# Patient Record
Sex: Female | Born: 1960 | Race: White | Hispanic: No | Marital: Married | Smoking: Never smoker
Health system: Southern US, Community
[De-identification: ages and names within clinical notes are randomized; demographics above are authoritative.]

## PROBLEM LIST (undated history)

## (undated) DIAGNOSIS — I10 Essential (primary) hypertension: Secondary | ICD-10-CM

## (undated) HISTORY — DX: Essential (primary) hypertension: I10

---

## 1997-10-09 ENCOUNTER — Other Ambulatory Visit: Admission: RE | Admit: 1997-10-09 | Discharge: 1997-10-09 | Payer: Self-pay | Admitting: Obstetrics and Gynecology

## 1998-05-30 ENCOUNTER — Inpatient Hospital Stay (HOSPITAL_COMMUNITY): Admission: AD | Admit: 1998-05-30 | Discharge: 1998-06-01 | Payer: Self-pay | Admitting: Unknown Physician Specialty

## 1998-07-04 ENCOUNTER — Encounter: Admission: RE | Admit: 1998-07-04 | Discharge: 1998-10-02 | Payer: Self-pay | Admitting: Obstetrics and Gynecology

## 1998-10-08 ENCOUNTER — Encounter (HOSPITAL_COMMUNITY): Admission: RE | Admit: 1998-10-08 | Discharge: 1999-01-06 | Payer: Self-pay | Admitting: Obstetrics and Gynecology

## 1998-10-28 ENCOUNTER — Other Ambulatory Visit: Admission: RE | Admit: 1998-10-28 | Discharge: 1998-10-28 | Payer: Self-pay | Admitting: Obstetrics and Gynecology

## 2001-02-17 ENCOUNTER — Other Ambulatory Visit: Admission: RE | Admit: 2001-02-17 | Discharge: 2001-02-17 | Payer: Self-pay | Admitting: Obstetrics and Gynecology

## 2002-02-20 ENCOUNTER — Other Ambulatory Visit: Admission: RE | Admit: 2002-02-20 | Discharge: 2002-02-20 | Payer: Self-pay | Admitting: Obstetrics and Gynecology

## 2003-05-14 ENCOUNTER — Other Ambulatory Visit: Admission: RE | Admit: 2003-05-14 | Discharge: 2003-05-14 | Payer: Self-pay | Admitting: Obstetrics and Gynecology

## 2004-04-02 ENCOUNTER — Encounter: Admission: RE | Admit: 2004-04-02 | Discharge: 2004-04-02 | Payer: Self-pay | Admitting: Obstetrics and Gynecology

## 2004-11-04 ENCOUNTER — Other Ambulatory Visit: Admission: RE | Admit: 2004-11-04 | Discharge: 2004-11-04 | Payer: Self-pay | Admitting: Obstetrics and Gynecology

## 2005-01-29 ENCOUNTER — Ambulatory Visit (HOSPITAL_COMMUNITY): Admission: RE | Admit: 2005-01-29 | Discharge: 2005-01-29 | Payer: Self-pay | Admitting: Obstetrics and Gynecology

## 2005-01-29 ENCOUNTER — Encounter (INDEPENDENT_AMBULATORY_CARE_PROVIDER_SITE_OTHER): Payer: Self-pay | Admitting: Specialist

## 2005-12-09 ENCOUNTER — Encounter: Admission: RE | Admit: 2005-12-09 | Discharge: 2005-12-09 | Payer: Self-pay | Admitting: Obstetrics and Gynecology

## 2008-10-08 ENCOUNTER — Encounter: Admission: RE | Admit: 2008-10-08 | Discharge: 2008-10-08 | Payer: Self-pay | Admitting: Obstetrics and Gynecology

## 2010-05-03 ENCOUNTER — Encounter: Payer: Self-pay | Admitting: Obstetrics and Gynecology

## 2010-05-04 ENCOUNTER — Encounter: Payer: Self-pay | Admitting: Obstetrics and Gynecology

## 2010-08-28 NOTE — Op Note (Signed)
Holly Choi, Holly Choi            ACCOUNT NO.:  [id number]   MEDICAL RECORD NO.:  [id number]          PATIENT TYPE:  AMB   LOCATION:  [facility]                           FACILITY:  [facility]   PHYSICIAN:  Rio L. Grewal, M.D.DATE OF BIRTH:  16-Dec-1960   DATE OF PROCEDURE:  01/29/2005  DATE OF DISCHARGE:                                 OPERATIVE REPORT   PREOPERATIVE DIAGNOSES:  1.  Menorrhagia.  2.  Pelvic relaxation.   POSTOPERATIVE DIAGNOSES:  1.  Menorrhagia.  2.  Pelvic relaxation.   PROCEDURES:  1.  Dilatation and curettage.  2.  Diagnostic hysteroscopy.  3.  ThermaChoice endometrial ablation.  4.  Perineoplasty.   SURGEON:  Delorise L. Vincente Poli, M.D.   ANESTHESIA:  General.   SPECIMENS:  Uterine curettings.   ESTIMATED BLOOD LOSS:  Minimal.   COMPLICATIONS:  None.   PROCEDURE:  Patient taken to the operating room.  She was intubated and  given anesthesia.  She was prepped and draped in the usual sterile fashion.  In-and-out catheter used to empty the bladder.  A speculum was inserted to  the vagina.  The cervix was grasped with a tenaculum and a paracervical  block was performed.  The cervical internal os was gently dilated using  Pratt dilators.  The diagnostic hysteroscope was inserted into the uterine  cavity and with excellent visualization the endometrium appeared normal.  The hysteroscope was removed and a thorough sharp uterine curettage was  performed of all four walls of the uterus and all tissue was sent to  pathology.  The ThermaChoice III was inserted into the uterus and  ThermaChoice III endometrial ablation is performed for eight minutes  according to the manufacturer's specifications.  The intact balloon was  removed.  Minimal drainage was noted.  We then removed the instruments from  the vagina.  At this point we performed a perineoplasty by placing an Allis  clamps 5 to 7 o'clock in the perineum, making a V-shaped incision with a  scalpel and undermining  some of the tissue using Metzenbaum scissors in the  perineum.  We then reinforced this area with interrupteds using 0 Vicryl  suture, reapproximating this and strengthening the perineal body and then  closed this using 2-0 Vicryl suture  continuous running stitch.  After the incision was closed, the patient was  awakened from anesthesia.  She went to the recovery room in stable  condition.  All sponge, lap and instrument counts were correct x2.      Sharine L. Vincente Poli, M.D.  Electronically Signed     MLG/MEDQ  D:  01/29/2005  T:  01/29/2005  Job:  [id number]

## 2012-03-22 ENCOUNTER — Other Ambulatory Visit: Payer: Self-pay | Admitting: Obstetrics and Gynecology

## 2012-03-31 ENCOUNTER — Other Ambulatory Visit: Payer: Self-pay | Admitting: Obstetrics and Gynecology

## 2012-03-31 DIAGNOSIS — R928 Other abnormal and inconclusive findings on diagnostic imaging of breast: Secondary | ICD-10-CM

## 2012-04-10 ENCOUNTER — Ambulatory Visit
Admission: RE | Admit: 2012-04-10 | Discharge: 2012-04-10 | Disposition: A | Discharge status: Home / Self Care | Payer: BC Managed Care – PPO | Source: Ambulatory Visit | Attending: Obstetrics and Gynecology | Admitting: Obstetrics and Gynecology

## 2012-04-10 DIAGNOSIS — R928 Other abnormal and inconclusive findings on diagnostic imaging of breast: Secondary | ICD-10-CM

## 2012-09-25 ENCOUNTER — Other Ambulatory Visit: Payer: Self-pay | Admitting: Obstetrics and Gynecology

## 2012-09-25 DIAGNOSIS — R921 Mammographic calcification found on diagnostic imaging of breast: Secondary | ICD-10-CM

## 2012-10-10 ENCOUNTER — Ambulatory Visit
Admission: RE | Admit: 2012-10-10 | Discharge: 2012-10-10 | Disposition: A | Discharge status: Home / Self Care | Payer: BC Managed Care – PPO | Source: Ambulatory Visit | Attending: Obstetrics and Gynecology | Admitting: Obstetrics and Gynecology

## 2012-10-10 DIAGNOSIS — R921 Mammographic calcification found on diagnostic imaging of breast: Secondary | ICD-10-CM

## 2013-04-19 ENCOUNTER — Other Ambulatory Visit: Payer: Self-pay | Admitting: Obstetrics and Gynecology

## 2013-04-19 DIAGNOSIS — R921 Mammographic calcification found on diagnostic imaging of breast: Secondary | ICD-10-CM

## 2013-04-30 ENCOUNTER — Ambulatory Visit
Admission: RE | Admit: 2013-04-30 | Discharge: 2013-04-30 | Disposition: A | Discharge status: Home / Self Care | Payer: BC Managed Care – PPO | Source: Ambulatory Visit | Attending: Obstetrics and Gynecology | Admitting: Obstetrics and Gynecology

## 2013-04-30 DIAGNOSIS — R921 Mammographic calcification found on diagnostic imaging of breast: Secondary | ICD-10-CM

## 2013-05-30 ENCOUNTER — Other Ambulatory Visit: Payer: Self-pay | Admitting: Obstetrics and Gynecology

## 2014-06-03 ENCOUNTER — Other Ambulatory Visit: Payer: Self-pay | Admitting: Obstetrics and Gynecology

## 2014-06-04 LAB — CYTOLOGY - PAP

## 2017-09-23 ENCOUNTER — Other Ambulatory Visit: Payer: Self-pay | Admitting: Obstetrics and Gynecology

## 2017-09-23 ENCOUNTER — Ambulatory Visit: Payer: BC Managed Care – PPO

## 2017-09-23 DIAGNOSIS — Z1231 Encounter for screening mammogram for malignant neoplasm of breast: Secondary | ICD-10-CM

## 2020-09-05 ENCOUNTER — Other Ambulatory Visit: Payer: Self-pay | Admitting: Pediatrics

## 2020-09-05 ENCOUNTER — Other Ambulatory Visit: Payer: Self-pay | Admitting: Radiology

## 2020-09-05 DIAGNOSIS — N63 Unspecified lump in unspecified breast: Secondary | ICD-10-CM

## 2020-09-12 ENCOUNTER — Ambulatory Visit
Admission: RE | Admit: 2020-09-12 | Discharge: 2020-09-12 | Disposition: A | Discharge status: Home / Self Care | Payer: BC Managed Care – PPO | Source: Ambulatory Visit | Attending: Radiology | Admitting: Radiology

## 2020-09-12 ENCOUNTER — Other Ambulatory Visit: Payer: Self-pay

## 2020-09-12 ENCOUNTER — Other Ambulatory Visit: Payer: Self-pay | Admitting: Radiology

## 2020-09-12 DIAGNOSIS — N63 Unspecified lump in unspecified breast: Secondary | ICD-10-CM

## 2020-09-19 ENCOUNTER — Other Ambulatory Visit: Payer: BC Managed Care – PPO

## 2020-09-22 ENCOUNTER — Other Ambulatory Visit: Payer: Self-pay | Admitting: Radiology

## 2020-09-22 ENCOUNTER — Other Ambulatory Visit: Payer: Self-pay

## 2020-09-22 ENCOUNTER — Ambulatory Visit
Admission: RE | Admit: 2020-09-22 | Discharge: 2020-09-22 | Disposition: A | Discharge status: Home / Self Care | Payer: BC Managed Care – PPO | Source: Ambulatory Visit | Attending: Radiology | Admitting: Radiology

## 2020-09-22 DIAGNOSIS — N63 Unspecified lump in unspecified breast: Secondary | ICD-10-CM

## 2020-12-25 ENCOUNTER — Other Ambulatory Visit: Payer: BC Managed Care – PPO

## 2021-01-01 ENCOUNTER — Other Ambulatory Visit: Payer: Self-pay

## 2021-01-01 ENCOUNTER — Ambulatory Visit
Admission: RE | Admit: 2021-01-01 | Discharge: 2021-01-01 | Disposition: A | Discharge status: Home / Self Care | Payer: BC Managed Care – PPO | Source: Ambulatory Visit | Attending: Radiology | Admitting: Radiology

## 2021-01-01 DIAGNOSIS — N63 Unspecified lump in unspecified breast: Secondary | ICD-10-CM

## 2021-03-06 ENCOUNTER — Ambulatory Visit: Payer: BC Managed Care – PPO | Admitting: Pulmonary Disease

## 2021-03-06 ENCOUNTER — Encounter: Payer: Self-pay | Admitting: Pulmonary Disease

## 2021-03-06 ENCOUNTER — Other Ambulatory Visit: Payer: Self-pay

## 2021-03-06 VITALS — BP 122/82 | HR 80 | Temp 98.0°F | Ht 66.0 in | Wt 189.0 lb

## 2021-03-06 DIAGNOSIS — G4733 Obstructive sleep apnea (adult) (pediatric): Secondary | ICD-10-CM

## 2021-03-06 NOTE — Progress Notes (Signed)
Holly Choi    [id number]    08-22-1960  Primary Care Physician:Corbello, Alycia Rossetti (Inactive)  Referring Physician: Marcelle Overlie, MD [address] [place],  [place] [address]  Chief complaint:    Patient with nonrestorative sleep, difficulty falling asleep, tired all the time  HPI:  Usually goes to bed between 10 and 11 Only able to fall asleep easily with use of medications-currently on doxepin Previously was on trazodone  2-3 awakenings Final wake up time about 5:45 AM  Weight is up about 25 pounds  She is active during the day  History of hypertension, allergies, sinus problems, chronic headaches  Snoring, choking respirations Admits to dryness of her mouth in the morning Memory is okay  Dad has obstructive sleep apnea  Never smoker  No outpatient encounter medications on file as of 03/06/2021.   No facility-administered encounter medications on file as of 03/06/2021.    Allergies as of 03/06/2021 - Review Complete 03/06/2021  Allergen Reaction Noted   Lisinopril Hives and Itching 03/01/2013   Sulfamethoxazole Hives 03/01/2013    No past medical history on file.  No past surgical history on file.  No family history on file.  Social History   Socioeconomic History   Marital status: Married    Spouse name: Not on file   Number of children: Not on file   Years of education: Not on file   Highest education level: Not on file  Occupational History   Not on file  Tobacco Use   Smoking status: Not on file   Smokeless tobacco: Not on file  Substance and Sexual Activity   Alcohol use: Not on file   Drug use: Not on file   Sexual activity: Not on file  Other Topics Concern   Not on file  Social History Narrative   Not on file   Social Determinants of Health   Financial Resource Strain: Not on file  Food Insecurity: Not on file  Transportation Needs: Not on file  Physical Activity: Not on file  Stress: Not on  file  Social Connections: Not on file  Intimate Partner Violence: Not on file    Review of Systems  Constitutional:  Positive for fatigue.  Psychiatric/Behavioral:  Positive for sleep disturbance.    There were no vitals filed for this visit.   Physical Exam Constitutional:      Appearance: She is obese.  HENT:     Head: Normocephalic.     Right Ear: Tympanic membrane normal.     Mouth/Throat:     Mouth: Mucous membranes are moist.     Comments: Mallampati 3, crowded oropharynx Eyes:     Pupils: Pupils are equal, round, and reactive to light.  Cardiovascular:     Rate and Rhythm: Normal rate and regular rhythm.     Heart sounds: No murmur heard.   No friction rub.  Pulmonary:     Effort: No respiratory distress.     Breath sounds: No stridor. No wheezing or rhonchi.  Musculoskeletal:     Cervical back: No rigidity or tenderness.  Neurological:     Mental Status: She is alert.  Psychiatric:        Mood and Affect: Mood normal.   Results of the Epworth flowsheet 03/06/2021  Sitting and reading 2  Watching TV 2  Sitting, inactive in a public place (e.g. a theatre or a meeting) 0  As a passenger in a car for  an hour without a break 1  Lying down to rest in the afternoon when circumstances permit 3  Sitting and talking to someone 0  Sitting quietly after a lunch without alcohol 0  In a car, while stopped for a few minutes in traffic 0  Total score 8     Data Reviewed: No previous sleep study on record  Assessment:  Multiple probability of significant obstructive sleep apnea  Excessive daytime sleepiness  Nonrestorative sleep  Poor quality sleep at night with multiple awakenings  Sleep onset insomnia  Pathophysiology of sleep disordered breathing discussed with the patient Treatment options discussed with the patient  Plan/Recommendations: Schedule patient for an in lab polysomnogram -Home sleep study may be falsely negative with multiple awakenings    Encouraged weight loss efforts  Encouraged regular exercise   Tentative follow-up in 3 to 4 months   Virl Diamond MD [facility] 03/06/2021, 10:44 AM  CC: Marcelle Overlie, MD

## 2021-03-06 NOTE — Patient Instructions (Signed)
Schedule for in lab polysomnogram  We will update you with results as soon as reviewed  Graded exercises as tolerated  Continue doxepin  Avoid behaviors that may lead to poor sleep quality-no late meals, no exercise close to bedtime, no bright lights close to bedtime  We will see you back in about 3 to 4 months   Sleep Apnea Sleep apnea affects breathing during sleep. It causes breathing to stop for 10 seconds or more, or to become shallow. People with sleep apnea usually snore loudly. It can also increase the risk of: Heart attack. Stroke. Being very overweight (obese). Diabetes. Heart failure. Irregular heartbeat. High blood pressure. The goal of treatment is to help you breathe normally again. What are the causes? The most common cause of this condition is a collapsed or blocked airway. There are three kinds of sleep apnea: Obstructive sleep apnea. This is caused by a blocked or collapsed airway. Central sleep apnea. This happens when the brain does not send the right signals to the muscles that control breathing. Mixed sleep apnea. This is a combination of obstructive and central sleep apnea. What increases the risk? Being overweight. Smoking. Having a small airway. Being older. Being female. Drinking alcohol. Taking medicines to calm yourself (sedatives or tranquilizers). Having family members with the condition. Having a tongue or tonsils that are larger than normal. What are the signs or symptoms? Trouble staying asleep. Loud snoring. Headaches in the morning. Waking up gasping. Dry mouth or sore throat in the morning. Being sleepy or tired during the day. If you are sleepy or tired during the day, you may also: Not be able to focus your mind (concentrate). Forget things. Get angry a lot and have mood swings. Feel sad (depressed). Have changes in your personality. Have less interest in sex, if you are female. Be unable to have an erection, if you are  female. How is this treated?  Sleeping on your side. Using a medicine to get rid of mucus in your nose (decongestant). Avoiding the use of alcohol, medicines to help you relax, or certain pain medicines (narcotics). Losing weight, if needed. Changing your diet. Quitting smoking. Using a machine to open your airway while you sleep, such as: An oral appliance. This is a mouthpiece that shifts your lower jaw forward. A CPAP device. This device blows air through a mask when you breathe out (exhale). An EPAP device. This has valves that you put in each nostril. A BIPAP device. This device blows air through a mask when you breathe in (inhale) and breathe out. Having surgery if other treatments do not work. Follow these instructions at home: Lifestyle Make changes that your doctor recommends. Eat a healthy diet. Lose weight if needed. Avoid alcohol, medicines to help you relax, and some pain medicines. Do not smoke or use any products that contain nicotine or tobacco. If you need help quitting, ask your doctor. General instructions Take over-the-counter and prescription medicines only as told by your doctor. If you were given a machine to use while you sleep, use it only as told by your doctor. If you are having surgery, make sure to tell your doctor you have sleep apnea. You may need to bring your device with you. Keep all follow-up visits. Contact a doctor if: The machine that you were given to use during sleep bothers you or does not seem to be working. You do not get better. You get worse. Get help right away if: Your chest hurts. You have trouble  breathing in enough air. You have an uncomfortable feeling in your back, arms, or stomach. You have trouble talking. One side of your body feels weak. A part of your face is hanging down. These symptoms may be an emergency. Get help right away. Call your local emergency services (911 in the [place] Do not wait to see if the symptoms will go  away. Do not drive yourself to the hospital. Summary This condition affects breathing during sleep. The most common cause is a collapsed or blocked airway. The goal of treatment is to help you breathe normally while you sleep. This information is not intended to replace advice given to you by your health care provider. Make sure you discuss any questions you have with your health care provider. Document Revised: 11/05/2020 Document Reviewed: 03/07/2020 Elsevier Patient Education  2022 [employer].

## 2021-04-20 ENCOUNTER — Ambulatory Visit (HOSPITAL_BASED_OUTPATIENT_CLINIC_OR_DEPARTMENT_OTHER): Payer: BC Managed Care – PPO | Attending: Pulmonary Disease | Admitting: Pulmonary Disease

## 2021-04-20 ENCOUNTER — Other Ambulatory Visit: Payer: Self-pay

## 2021-04-20 DIAGNOSIS — G4733 Obstructive sleep apnea (adult) (pediatric): Secondary | ICD-10-CM | POA: Diagnosis present

## 2021-04-20 DIAGNOSIS — G4710 Hypersomnia, unspecified: Secondary | ICD-10-CM | POA: Diagnosis not present

## 2021-04-26 ENCOUNTER — Telehealth: Payer: Self-pay | Admitting: Pulmonary Disease

## 2021-04-26 NOTE — Progress Notes (Deleted)
POLYSOMNOGRAPHY  Last, First: Holly, Choi MRN: [id number] Gender: Female Age (years): 60 Weight (lbs): 185 DOB: 1960-04-17 BMI: 30 Primary Care: No PCP Epworth Score: 12 Referring: Tomma Lightning MD Technician: Rosette Reveal Interpreting: Tomma Lightning MD Study Type: NPSG Ordered Study Type: NPSG Study date: 04/20/2021 Location: [facility] CLINICAL INFORMATION Holly Choi is a 61 year old Female and was referred to the sleep center for evaluation of G47.33 OSA: Adult and Pediatric (327.23). Indications include Hypertension.  MEDICATIONS Patient self administered medications include: DOXEPIN. Medications administered during study include No sleep medicine administered.  SLEEP STUDY TECHNIQUE A multi-channel overnight Polysomnography study was performed. The channels recorded and monitored were central and occipital EEG, electrooculogram (EOG), submentalis EMG (chin), nasal and oral airflow, thoracic and abdominal wall motion, anterior tibialis EMG, snore microphone, electrocardiogram, and a pulse oximetry. TECHNICIAN COMMENTS Comments added by Technician: None Comments added by Scorer: N/A SLEEP ARCHITECTURE The study was initiated at 11:24:31 PM and terminated at 5:39:24 AM. The total recorded time was 374.9 minutes. EEG confirmed total sleep time was 308 minutes yielding a sleep efficiency of 82.2%%. Sleep onset after lights out was 29.3 minutes with a REM latency of 36.5 minutes. The patient spent 6.7%% of the night in stage N1 sleep, 64.1%% in stage N2 sleep, 0.0%% in stage N3 and 29.2% in REM. Wake after sleep onset (WASO) was 37.5 minutes. The Arousal Index was 4.3/hour. RESPIRATORY PARAMETERS There were a total of 8 respiratory disturbances out of which 0 were apneas ( 0 obstructive, 0 mixed, 0 central) and 8 hypopneas. The apnea/hypopnea index (AHI) was 1.6 events/hour. The central sleep apnea index was 0 events/hour. The REM AHI was 5.3 events/hour and NREM  AHI was 0.0 events/hour. The supine AHI was N/A events/hour and the non supine AHI was 1.6 supine during 0.00% of sleep. Respiratory disturbances were associated with oxygen desaturation down to a nadir of 88.0% during sleep. The mean oxygen saturation during the study was 92.7%. The cumulative time under 88% oxygen saturation was 5.5 minutes.  LEG MOVEMENT DATA The total leg movements were 0 with a resulting leg movement index of 0.0/hr .Associated arousal with leg movement index was 0.0/hr.  CARDIAC DATA The underlying cardiac rhythm was most consistent with sinus rhythm. Mean heart rate during sleep was 68.5 bpm. Additional rhythm abnormalities include None.  IMPRESSIONS - No Significant Obstructive Sleep apnea(OSA) - EKG showed no cardiac abnormalities. - Mild Oxygen Desaturation - The patient snored with soft snoring volume. - No significant periodic leg movements(PLMs) during sleep. However, no significant associated arousals.  DIAGNOSIS - Excessive daytime sleepiness - Study is negative for significant obstructive sleep apnea  RECOMMENDATIONS - Avoid alcohol, sedatives and other CNS depressants that may worsen sleep apnea and disrupt normal sleep architecture. - Sleep hygiene should be reviewed to assess factors that may improve sleep quality. - Weight management and regular exercise should be initiated or continued.  [Electronically signed] 04/26/2021 12:24 PM  Virl Diamond MD NPI: [id number]

## 2021-04-26 NOTE — Telephone Encounter (Signed)
Call patient  Sleep study result  Date of study: 04/20/2021  Impression: Study negative for significant obstructive sleep apnea Excessive daytime sleepiness  Recommendation: Optimize sleep hygiene  6 to 8 hours of sleep recommended  Sleep position modification to decrease snoring may include elevation of the head of the bed by 30 degrees, avoid supine sleep as possible  Encouraged continuing weight loss efforts  Follow-up in the office as previously scheduled

## 2021-04-26 NOTE — Progress Notes (Signed)
POLYSOMNOGRAPHY ° °Last, First: Choi Choi °MRN: [id number] °Gender: Female °Age (years): 60 °Weight (lbs): 185 °DOB: 05/01/1960 °BMI: 30 °Primary Care: No PCP °Epworth Score: 12 °Referring: Myley Bahner A Siham Bucaro MD °Technician: Steffey, Kevin °Interpreting: Nyelli Samara A Truong Delcastillo MD °Study Type: NPSG °Ordered Study Type: NPSG °Study date: 04/20/2021 °Location: [facility] °CLINICAL INFORMATION °Choi Choi is a 60 year old Female and was referred to the sleep center for evaluation of G47.33 OSA: Adult and Pediatric (327.23). Indications include Hypertension. ° °MEDICATIONS °Patient self administered medications include: DOXEPIN. Medications administered during study include No sleep medicine administered. ° °SLEEP STUDY TECHNIQUE °A multi-channel overnight Polysomnography study was performed. The channels recorded and monitored were central and occipital EEG, electrooculogram (EOG), submentalis EMG (chin), nasal and oral airflow, thoracic and abdominal wall motion, anterior tibialis EMG, snore microphone, electrocardiogram, and a pulse oximetry. °TECHNICIAN COMMENTS °Comments added by Technician: None °Comments added by Scorer: N/A °SLEEP ARCHITECTURE °The study was initiated at 11:24:31 PM and terminated at 5:39:24 AM. The total recorded time was 374.9 minutes. EEG confirmed total sleep time was 308 minutes yielding a sleep efficiency of 82.2%%. Sleep onset after lights out was 29.3 minutes with a REM latency of 36.5 minutes. The patient spent 6.7%% of the night in stage N1 sleep, 64.1%% in stage N2 sleep, 0.0%% in stage N3 and 29.2% in REM. Wake after sleep onset (WASO) was 37.5 minutes. The Arousal Index was 4.3/hour. °RESPIRATORY PARAMETERS °There were a total of 8 respiratory disturbances out of which 0 were apneas ( 0 obstructive, 0 mixed, 0 central) and 8 hypopneas. The apnea/hypopnea index (AHI) was 1.6 events/hour. The central sleep apnea index was 0 events/hour. The REM AHI was 5.3 events/hour and NREM  AHI was 0.0 events/hour. The supine AHI was N/A events/hour and the non supine AHI was 1.6 supine during 0.00% of sleep. Respiratory disturbances were associated with oxygen desaturation down to a nadir of 88.0% during sleep. The mean oxygen saturation during the study was 92.7%. The cumulative time under 88% oxygen saturation was 5.5 minutes. ° °LEG MOVEMENT DATA °The total leg movements were 0 with a resulting leg movement index of 0.0/hr .Associated arousal with leg movement index was 0.0/hr. ° °CARDIAC DATA °The underlying cardiac rhythm was most consistent with sinus rhythm. Mean heart rate during sleep was 68.5 bpm. Additional rhythm abnormalities include None. ° °IMPRESSIONS °- No Significant Obstructive Sleep apnea(OSA) °- EKG showed no cardiac abnormalities. °- Mild Oxygen Desaturation °- The patient snored with soft snoring volume. °- No significant periodic leg movements(PLMs) during sleep. However, no significant associated arousals. ° °DIAGNOSIS °- Excessive daytime sleepiness °- Study is negative for significant obstructive sleep apnea ° °RECOMMENDATIONS °- Avoid alcohol, sedatives and other CNS depressants that may worsen sleep apnea and disrupt normal sleep architecture. °- Sleep hygiene should be reviewed to assess factors that may improve sleep quality. °- Weight management and regular exercise should be initiated or continued. ° °[Electronically signed] 04/26/2021 12:24 PM ° °Browning Southwood MD °NPI: [id number] ° °

## 2021-04-27 NOTE — Telephone Encounter (Signed)
Called and spoke with pt letting her know the results of HST and recommendations per AO. Pt verbalized understanding. Pt did not have a f/u scheduled so I did make pt an appt with AO. Nothing further needed.

## 2021-04-27 NOTE — Telephone Encounter (Signed)
I called and left a message for the patient to call back

## 2021-06-22 ENCOUNTER — Ambulatory Visit: Payer: BC Managed Care – PPO | Admitting: Pulmonary Disease

## 2022-07-13 IMAGING — MG MM DIGITAL DIAGNOSTIC UNILAT*R* W/ TOMO W/ CAD
6 series · 7 of 18 positions shown · non-contrast
Comparison: Previous exam(s).
COMPARISON: Previous exam(s).

Addendum:
CLINICAL DATA: 60-year-old female with a right breast palpable lump
for approximately 1 month.

EXAM:
DIGITAL DIAGNOSTIC UNILATERAL RIGHT MAMMOGRAM WITH TOMOSYNTHESIS AND
CAD; ULTRASOUND RIGHT BREAST LIMITED
TECHNIQUE: Right digital diagnostic mammography and breast tomosynthesis was
performed. The images were evaluated with computer-aided detection.;
Targeted ultrasound examination of the right breast was performed

[R MLO synth-2D]
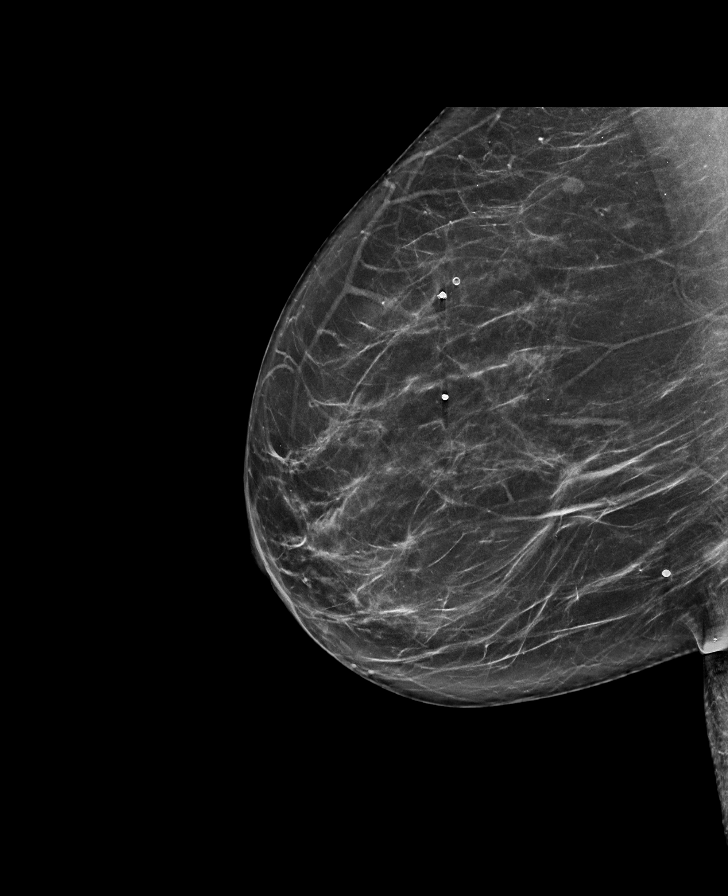

[R CC synth-2D]
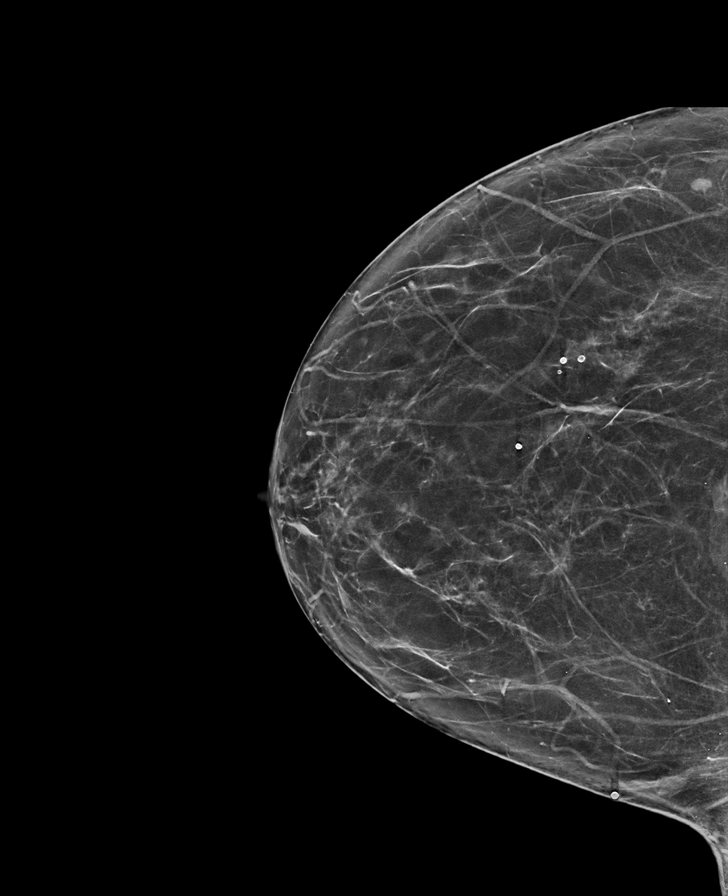

[R TAN synth-2D]
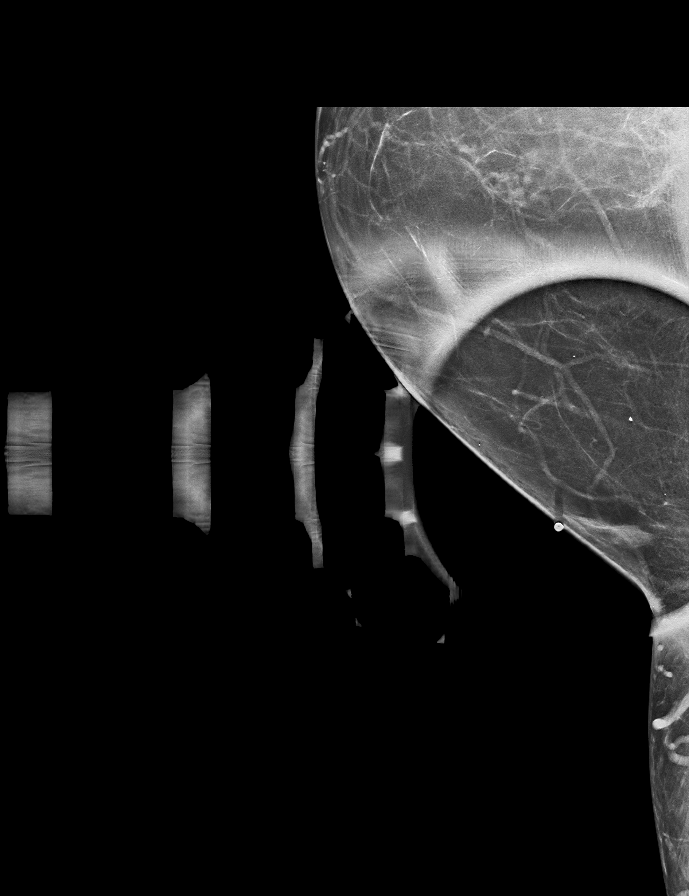

[R TAN tomo · 2 of 58 frames shown]
[frame 19/58]
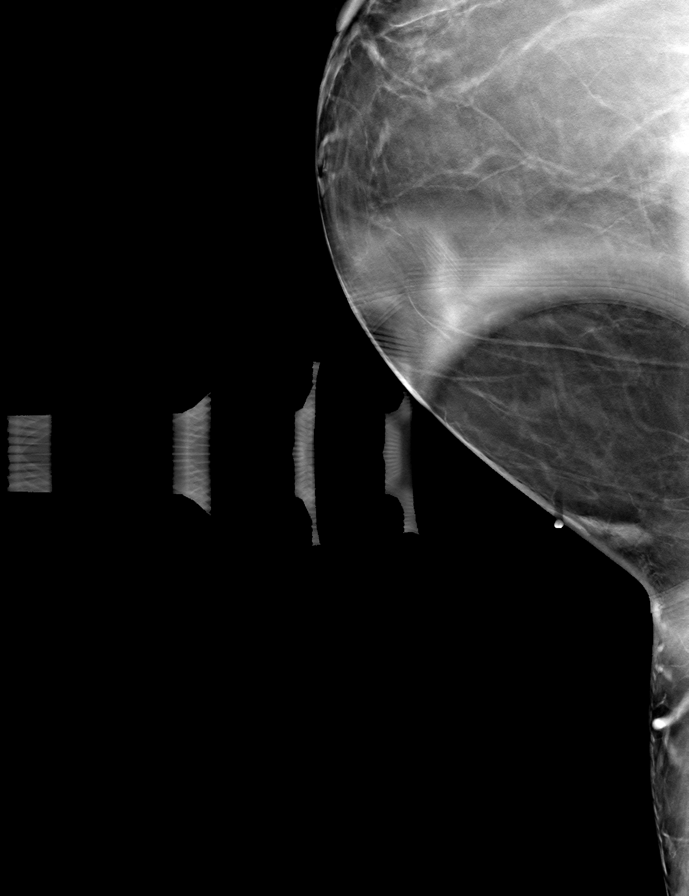
[frame 29/58]
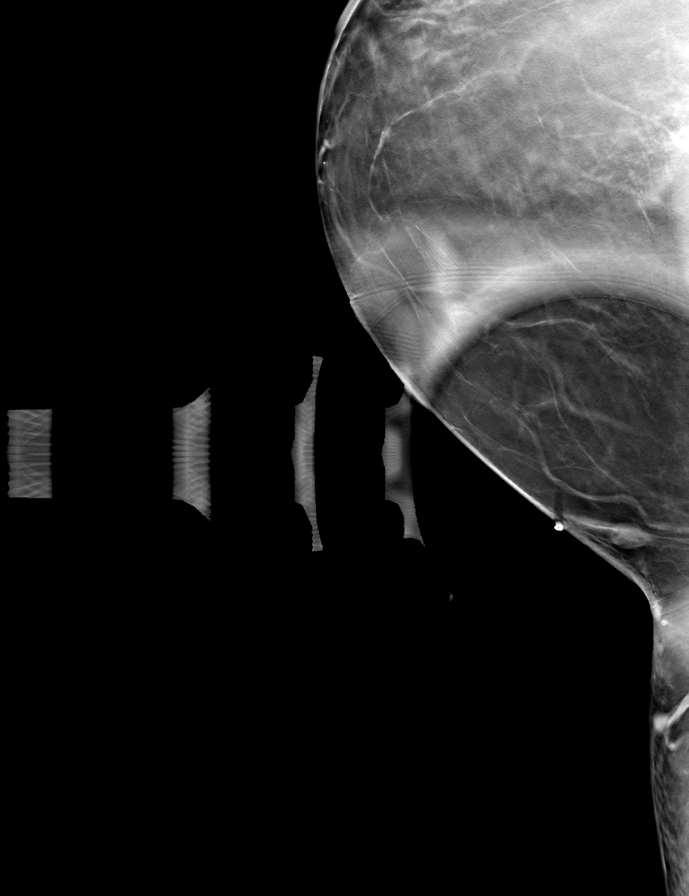

[R CC tomo · tomo slice 35/70.0]
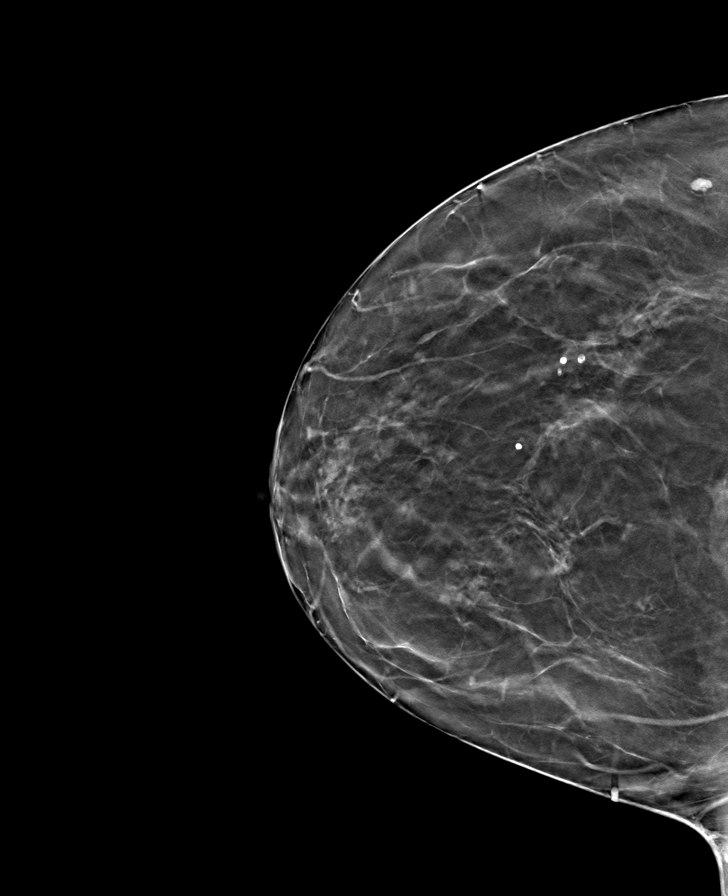

[R MLO tomo · tomo slice 39/78.0]
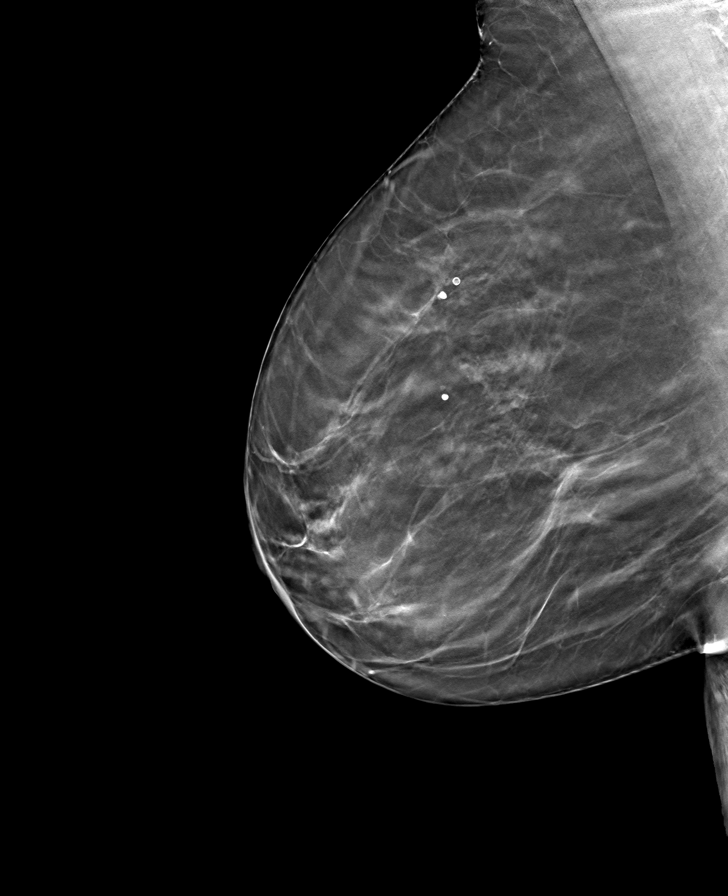

[7 of 18 positions shown; findings below may reference images not displayed]

ACR Breast Density Category b: There are scattered areas of
fibroglandular density.
FINDINGS: A radiopaque BB was placed at the site of the patient's right breast
palpable lump in the far medial aspect. An irregular equal density
mass is seen just deep to the radiopaque BB. No additional
suspicious findings in the remainder of the right breast.

On physical exam, I palpate a 2-3 mm firm nodule along the far
medial right breast.

Targeted ultrasound is performed, showing an irregular hypoechoic
mass at the 3 o'clock position 10 cm from the nipple. It measures 6
x 6 x 4 mm. There is peripheral but no internal vascularity. This
correlates well with the mammographic finding. There is question of
a tract to the overlying skin best seen on real-time scanning.
Evaluation of the right axilla demonstrates no suspicious
lymphadenopathy.
IMPRESSION: 1. Indeterminate right breast mass at the 3 o'clock position 10 cm
from the nipple. There is a questioned tract running to the
overlying skin best seen during real-time scanning. However,
definitive tissue sampling is recommended.
2. No suspicious right axillary lymphadenopathy.

RECOMMENDATION:
Ultrasound-guided biopsy of the right breast.

I have discussed the findings and recommendations with the patient.
If applicable, a reminder letter will be sent to the patient
regarding the next appointment.

BI-RADS CATEGORY  4: Suspicious.

ADDENDUM:
Ms. Gearoid presented for biopsy of the right breast mass at 3
o'clock on 09/22/2020. On the initial scanning in preparation for
the biopsy the mass in the right breast at 3 o'clock appeared
significantly smaller with only a small cystic area just below the
skin surface with a connecting tract to the skin surface. The
finding most likely represents a resolving sebaceous cyst.
Additionally the patient reports the palpable area has decreased in
size since the diagnostic workup.

Recommendation: Right breast ultrasound in 3 months.

BI-RADS category 3: probably benign.

*** End of Addendum ***
ACR Breast Density Category b: There are scattered areas of
fibroglandular density.
FINDINGS: A radiopaque BB was placed at the site of the patient's right breast
palpable lump in the far medial aspect. An irregular equal density
mass is seen just deep to the radiopaque BB. No additional
suspicious findings in the remainder of the right breast.

On physical exam, I palpate a 2-3 mm firm nodule along the far
medial right breast.

Targeted ultrasound is performed, showing an irregular hypoechoic
mass at the 3 o'clock position 10 cm from the nipple. It measures 6
x 6 x 4 mm. There is peripheral but no internal vascularity. This
correlates well with the mammographic finding. There is question of
a tract to the overlying skin best seen on real-time scanning.
Evaluation of the right axilla demonstrates no suspicious
lymphadenopathy.
IMPRESSION: 1. Indeterminate right breast mass at the 3 o'clock position 10 cm
from the nipple. There is a questioned tract running to the
overlying skin best seen during real-time scanning. However,
definitive tissue sampling is recommended.
2. No suspicious right axillary lymphadenopathy.

RECOMMENDATION:
Ultrasound-guided biopsy of the right breast.

I have discussed the findings and recommendations with the patient.
If applicable, a reminder letter will be sent to the patient
regarding the next appointment.

BI-RADS CATEGORY  4: Suspicious.

## 2023-11-16 NOTE — Procedures (Signed)
 See progress note or media.

## 2024-07-23 ENCOUNTER — Ambulatory Visit (HOSPITAL_COMMUNITY): Admit: 2024-07-23 | Payer: Self-pay | Admitting: Obstetrics and Gynecology
